# Patient Record
Sex: Female | Born: 1940 | Race: White | Hispanic: No | Marital: Single | Smoking: Former smoker
Health system: Southern US, Community
[De-identification: ages and names within clinical notes are randomized; demographics above are authoritative.]

## PROBLEM LIST (undated history)

## (undated) HISTORY — PX: APPENDECTOMY: SHX54

## (undated) HISTORY — PX: SKIN BIOPSY: SHX1

---

## 2001-03-03 ENCOUNTER — Encounter: Payer: Self-pay | Admitting: Otolaryngology

## 2001-03-03 ENCOUNTER — Encounter: Admission: RE | Admit: 2001-03-03 | Discharge: 2001-03-03 | Payer: Self-pay | Admitting: Otolaryngology

## 2001-03-25 ENCOUNTER — Other Ambulatory Visit: Admission: RE | Admit: 2001-03-25 | Discharge: 2001-03-25 | Payer: Self-pay | Admitting: Otolaryngology

## 2003-07-30 ENCOUNTER — Inpatient Hospital Stay (HOSPITAL_COMMUNITY): Admission: EM | Admit: 2003-07-30 | Discharge: 2003-08-01 | Payer: Self-pay

## 2003-08-31 ENCOUNTER — Other Ambulatory Visit: Admission: RE | Admit: 2003-08-31 | Discharge: 2003-08-31 | Payer: Self-pay | Admitting: Obstetrics and Gynecology

## 2003-09-05 ENCOUNTER — Encounter: Admission: RE | Admit: 2003-09-05 | Discharge: 2003-09-05 | Payer: Self-pay | Admitting: Gastroenterology

## 2003-09-07 ENCOUNTER — Ambulatory Visit (HOSPITAL_COMMUNITY): Admission: RE | Admit: 2003-09-07 | Discharge: 2003-09-07 | Payer: Self-pay | Admitting: Obstetrics and Gynecology

## 2003-10-02 ENCOUNTER — Ambulatory Visit (HOSPITAL_COMMUNITY): Admission: RE | Admit: 2003-10-02 | Discharge: 2003-10-03 | Payer: Self-pay | Admitting: Surgery

## 2004-02-06 ENCOUNTER — Ambulatory Visit (HOSPITAL_COMMUNITY): Admission: RE | Admit: 2004-02-06 | Discharge: 2004-02-06 | Payer: Self-pay | Admitting: Obstetrics and Gynecology

## 2004-08-23 ENCOUNTER — Ambulatory Visit (HOSPITAL_COMMUNITY): Admission: RE | Admit: 2004-08-23 | Discharge: 2004-08-23 | Payer: Self-pay | Admitting: Obstetrics and Gynecology

## 2005-01-16 IMAGING — US US ABDOMEN COMPLETE
1 series · 14 of 25 positions shown · IV contrast (agent unspecified)
Comparison: none

CLINICAL DATA: Stomach cramps. 
ULTRASOUND OF THE ABDOMEN
Multiple longitudinal and transverse images were obtained.  
The liver and spleen appear unremarkable.  Gallbladder is normal without stones or wall thickening.  The common bile duct measures 3.0 mm.  The kidneys are normal without masses or hydronephrosis with the right measuring 10.2 and the left 10.7 cm.  The abdominal aorta measures 1.6 cm.  
In the right lower quadrant in the area of the patient?s tenderness, there is a prominent loop of bowel and/or enlarged appendix measuring 2.5 X 4.0 cm in cross section.  I would recommend a CT of the abdomen and pelvis with contrast for further evaluation to evaluate for Crohn?s disease, appendicitis, diverticulitis or other abdominal pathology.  
IMPRESSION
No gallstones. 
Possible bowel related inflammatory process right lower quadrant.  Recommend CT abdomen and pelvis. 

ree

[Series 1: us abdomen · 0.33mm/px · 14 of 52 slices shown]
[im 1/52]
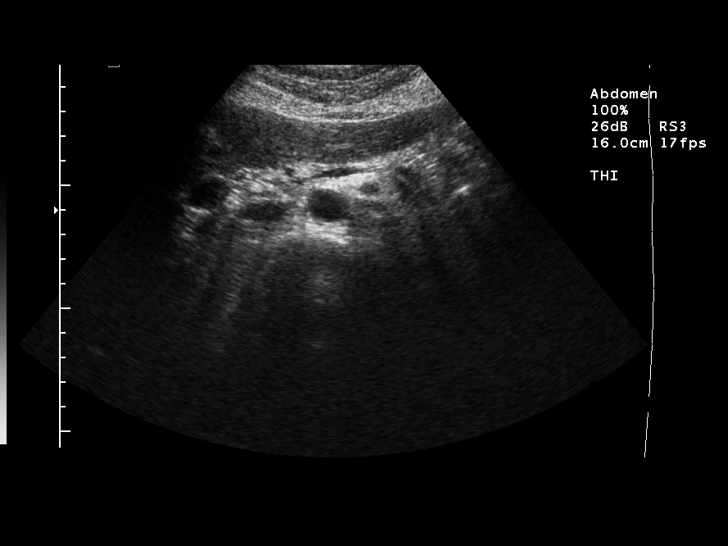
[im 5/52]
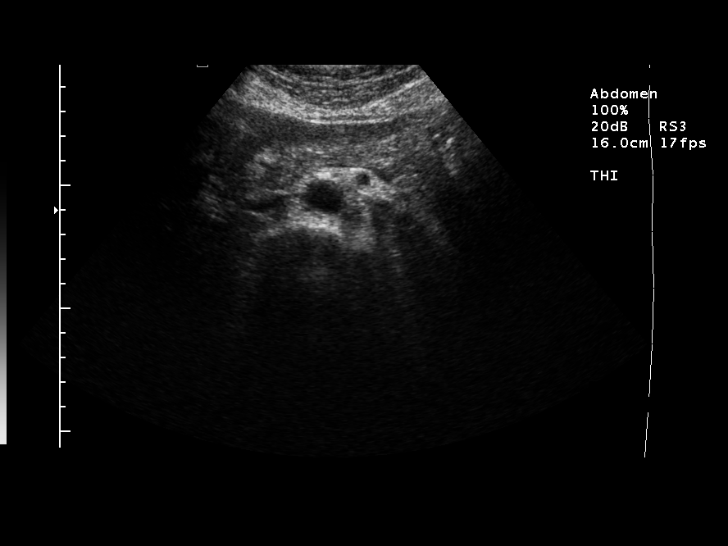
[im 9/52]
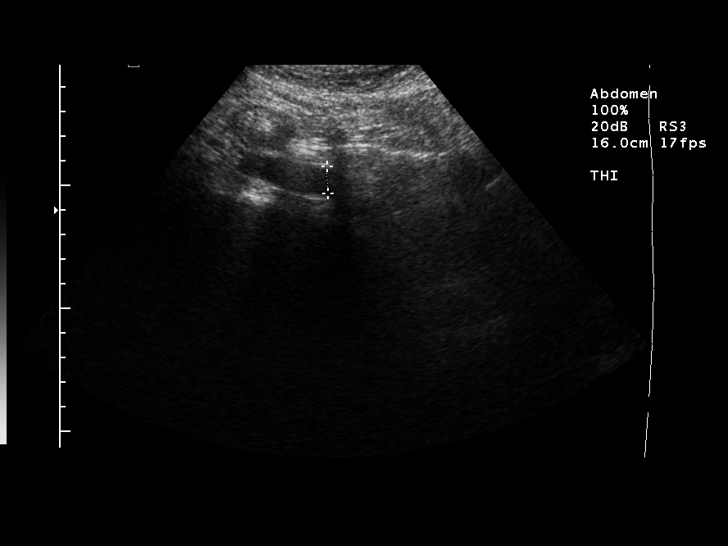
[im 13/52]
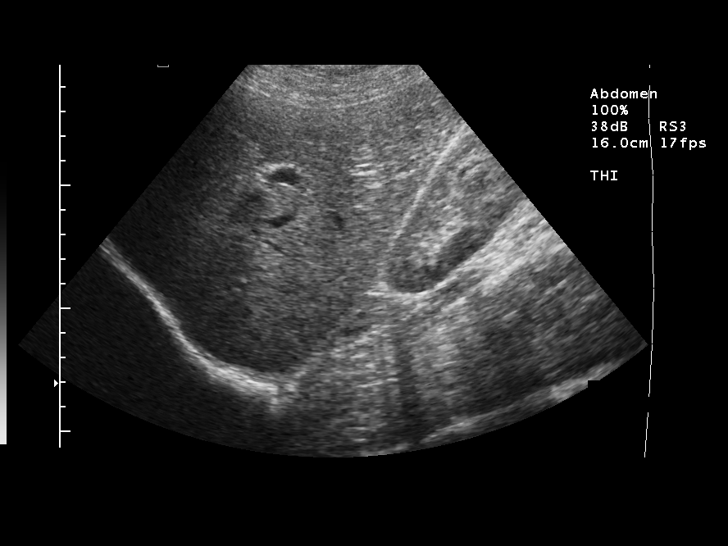
[im 18/52]
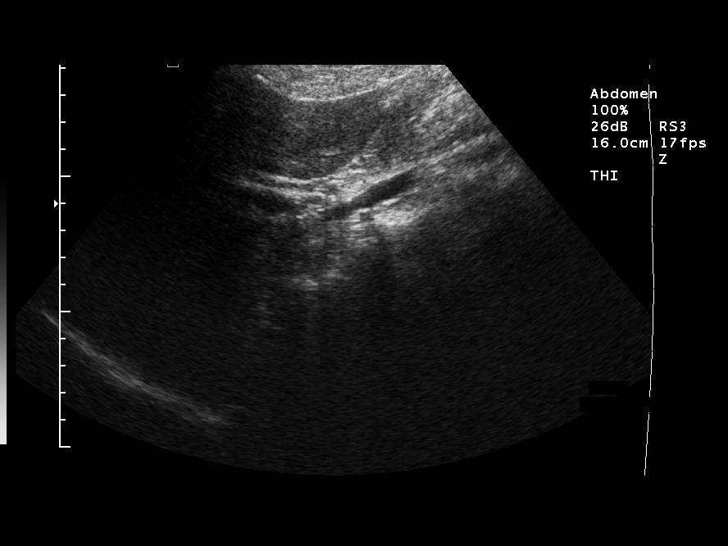
[im 20/52]
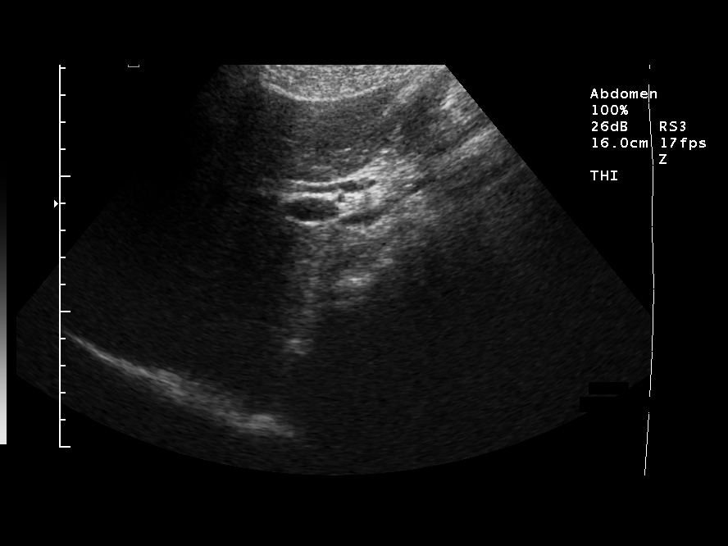
[im 24/52]
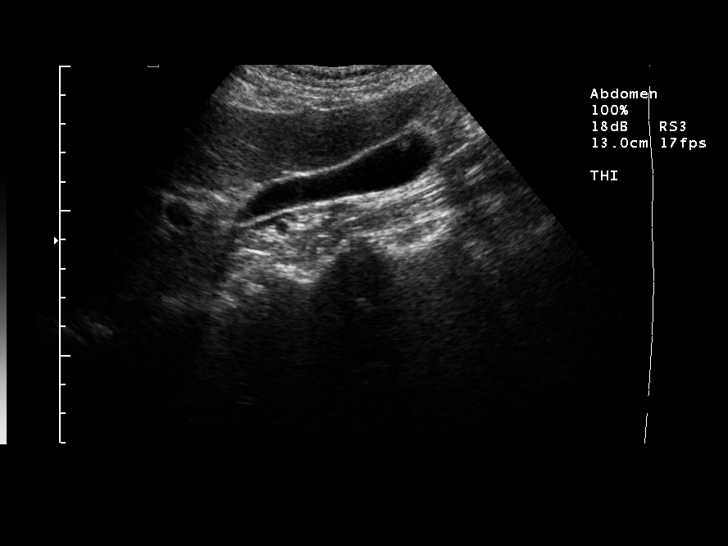
[im 28/52]
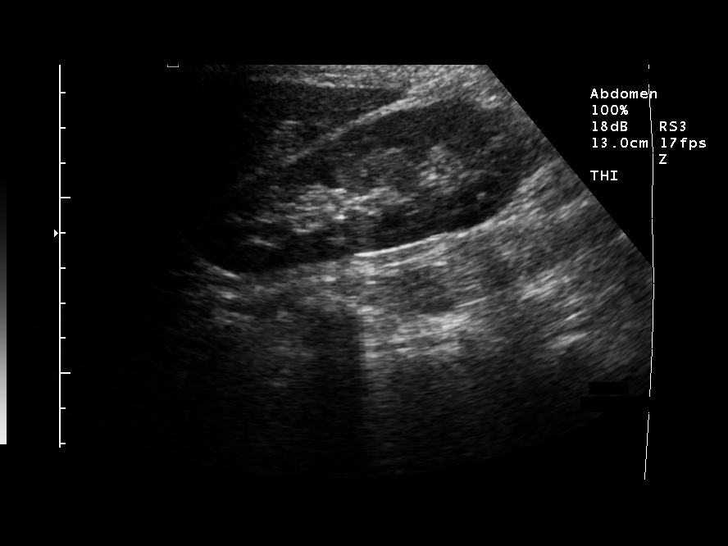
[im 32/52]
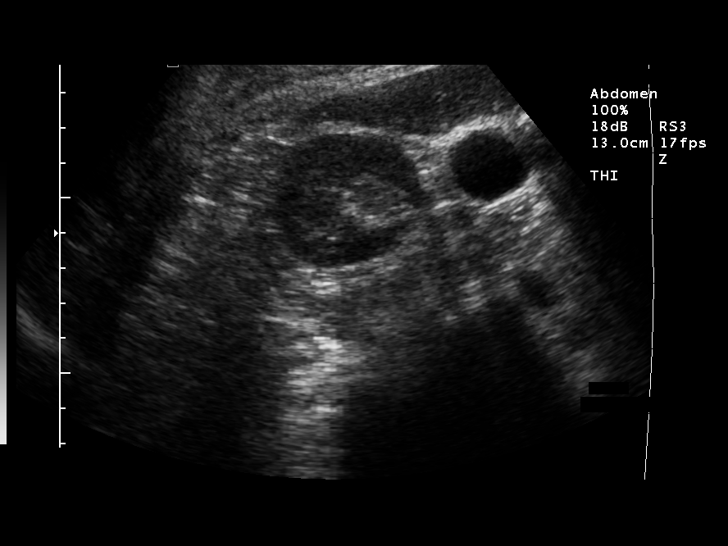
[im 35/52]
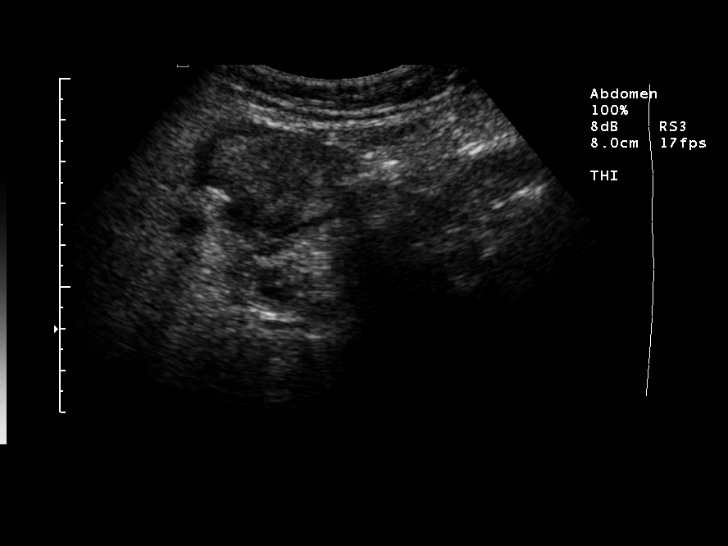
[im 39/52]
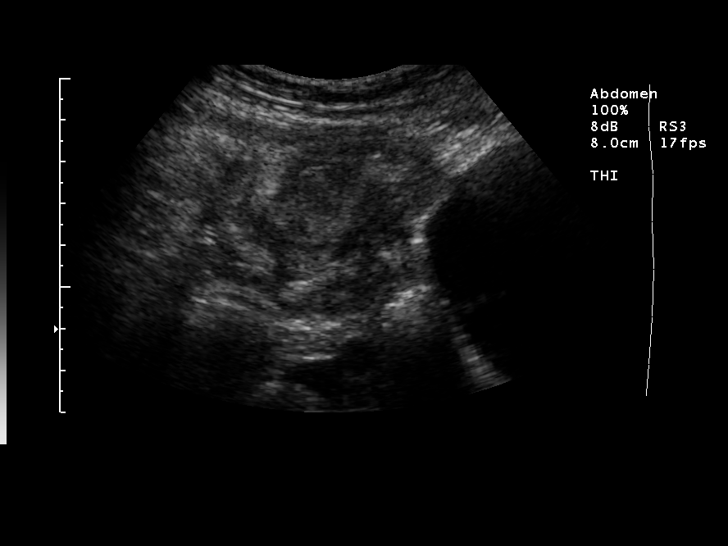
[im 43/52]
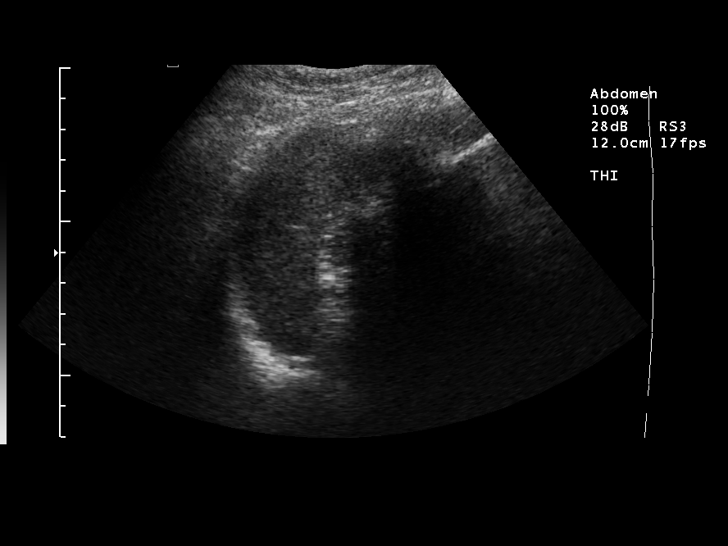
[im 47/52]
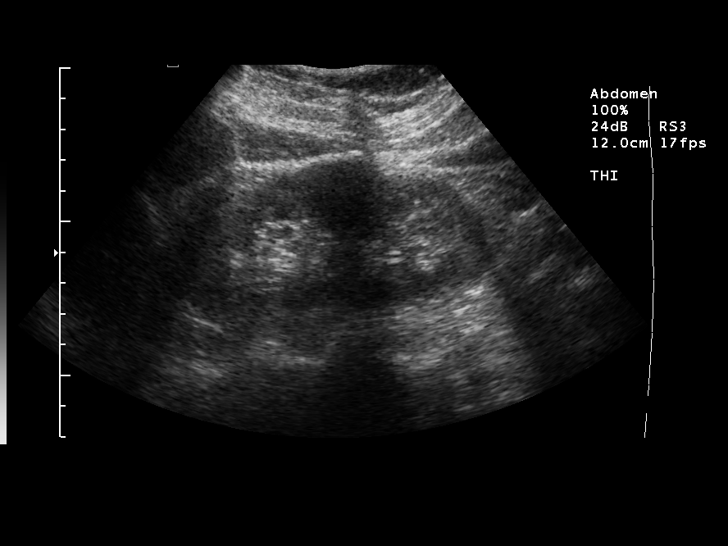
[im 52/52]
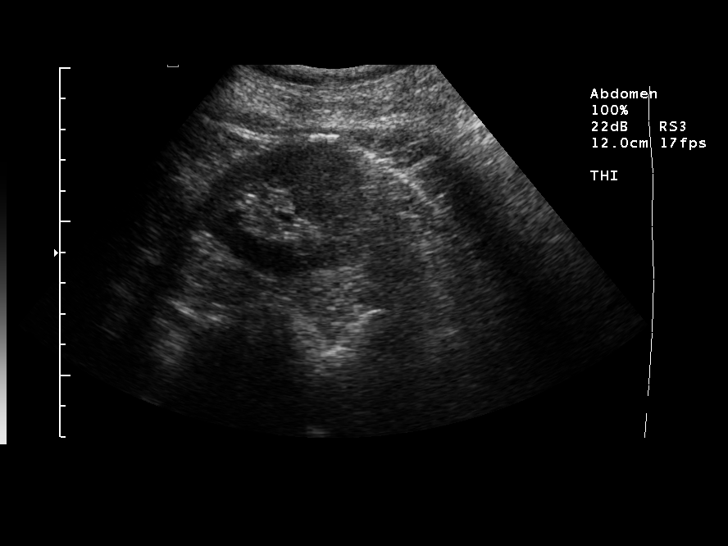

[14 of 25 positions shown; findings below may reference images not displayed]

## 2005-02-22 IMAGING — CT CT ABDOMEN W/ CM
1 series · 14 of 32 positions shown, 18 images · IV contrast (GASTRO. & OMNIPAQUE [ID])
Comparison: none

CLINICAL DATA: Abdominal pain.  Follow up abnormality found in right lower quadrant on prior CT.   Con none. 
 CT ABDOMEN W/CONTRAST: 
 Multidetector helical scans through the abdomen were performed after oral and IV contrast media were given.  744cc of Omnipaque 300 were given as the contrast media.  This scan is compared to the prior CT from [REDACTED] dated 07/30/03.  
 The lung bases are clear.  The liver enhances normally with probable hemangioma in the periphery of the right lobe posterolaterally unchanged.  No calcified gallstones are noted.  The pancreas is stable in size and configuration as are the adrenal glands and spleen.  The kidneys enhance normally with no solid lesion and no evidence of hydronephrosis.  On delayed images the pelvocaliceal systems appear normal.  The ureters are normal in caliber.  The abdominal aorta appears normal and no evidence of adenopathy is seen.

[Series 2: — · axial · 0.62mm/px · z∈[-369,-21]mm · 14 of 118 slices shown, 18 images]
[im 8/118  soft-tissue]
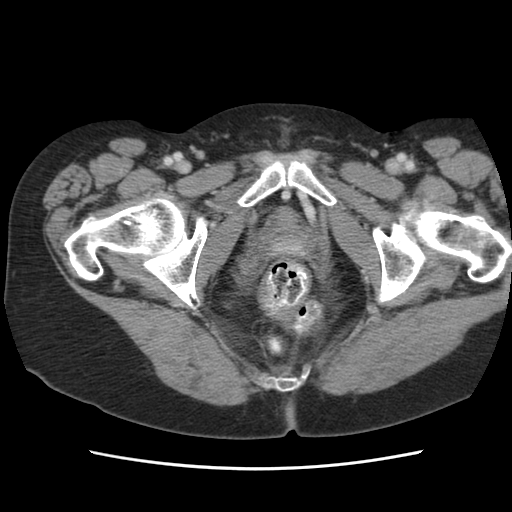
[im 8/118  bone]
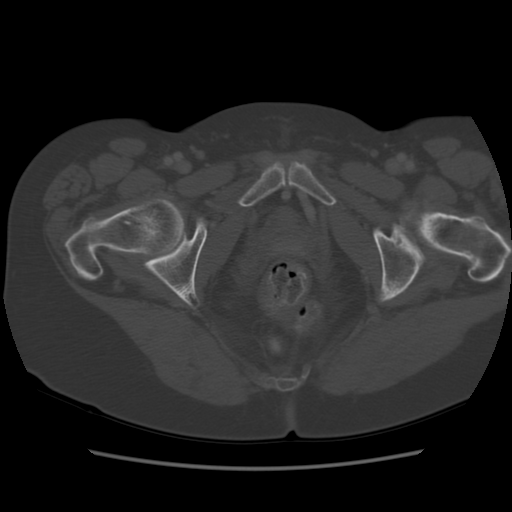
[im 16/118  soft-tissue]
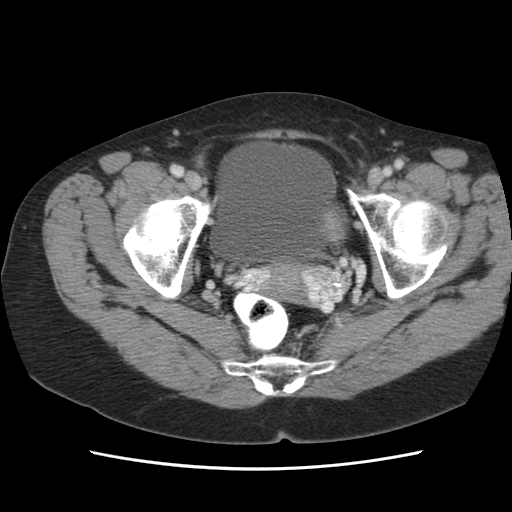
[im 27/118  soft-tissue]
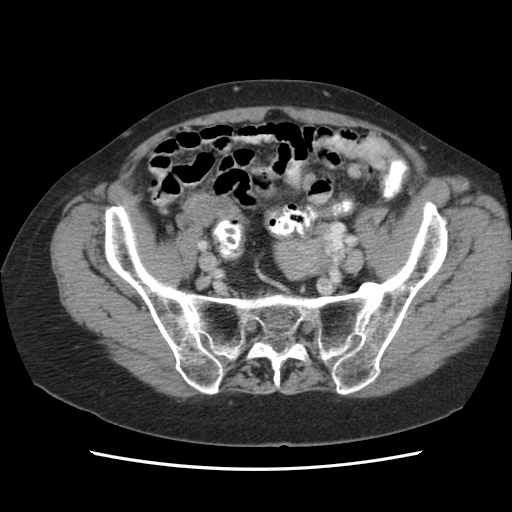
[im 34/118  soft-tissue]
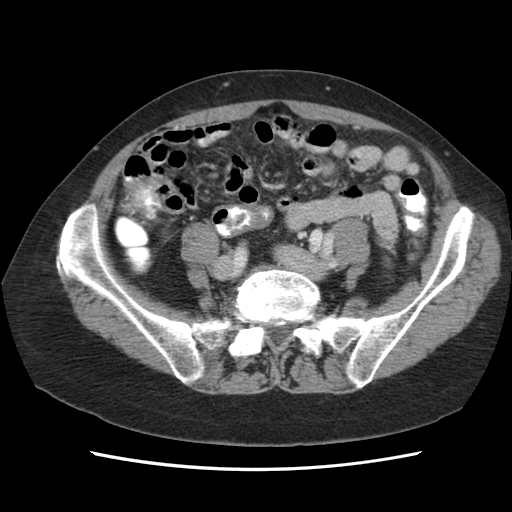
[im 46/118  soft-tissue]
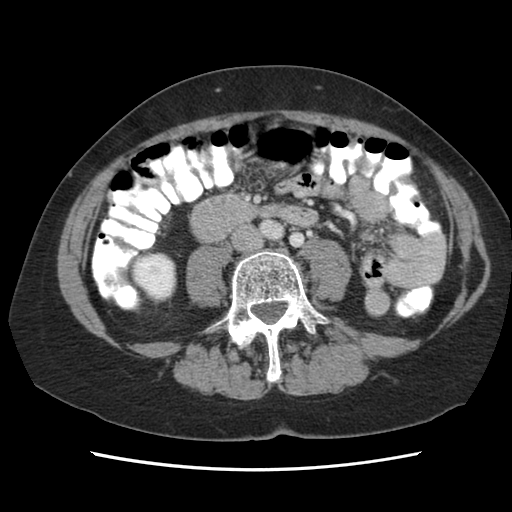
[im 53/118  soft-tissue]
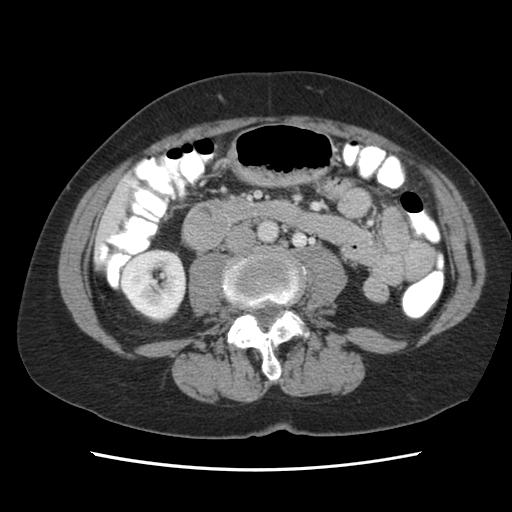
[im 65/118  soft-tissue]
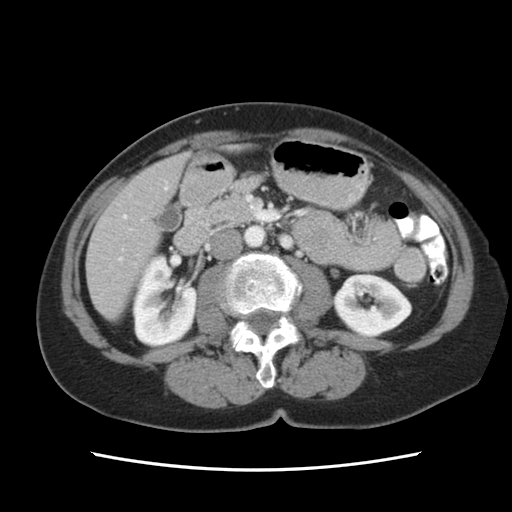
[im 72/118  soft-tissue]
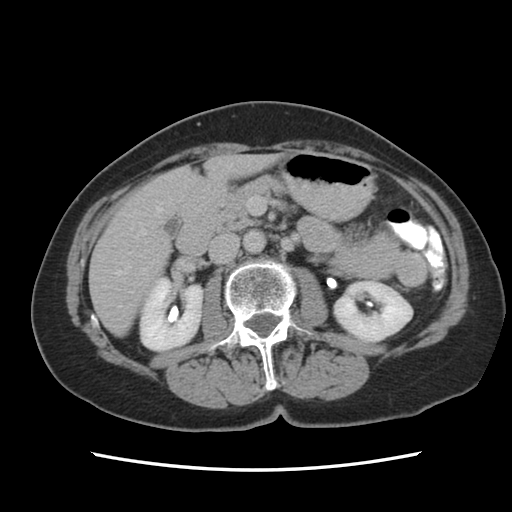
[im 84/118  soft-tissue]
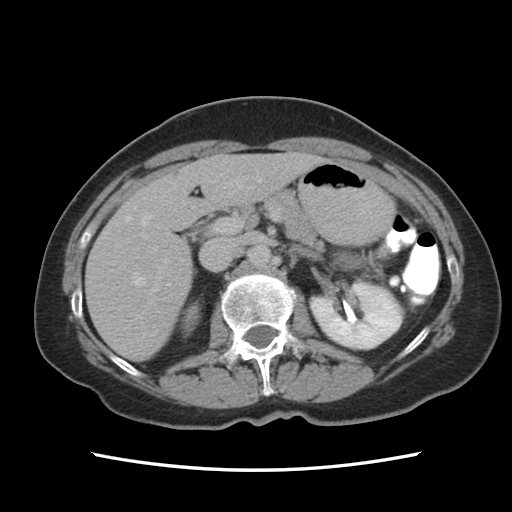
[im 84/118  bone]
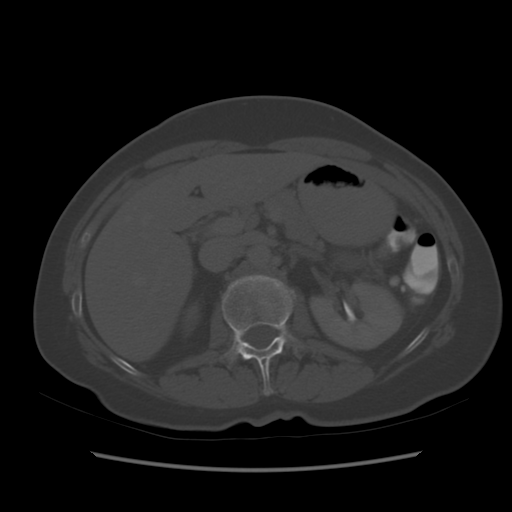
[im 91/118  soft-tissue]
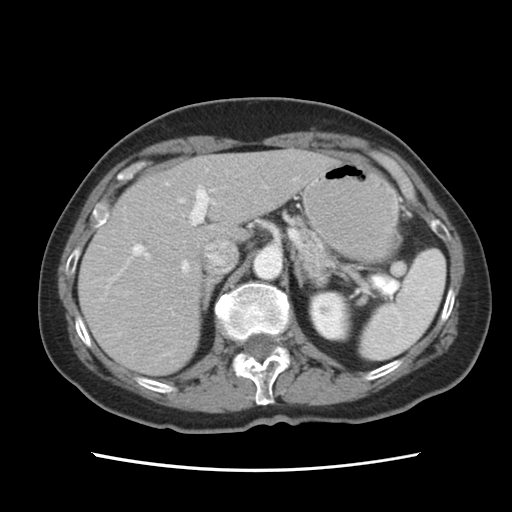
[im 102/118  soft-tissue]
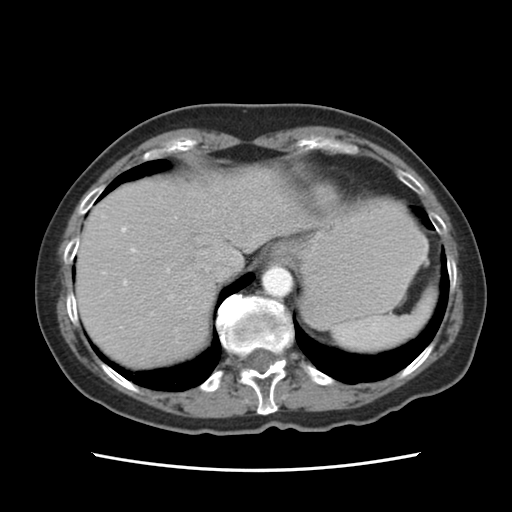
[im 102/118  lung]
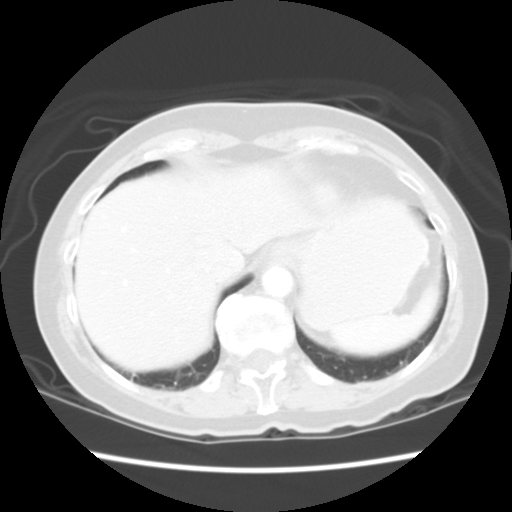
[im 106/118  lung]
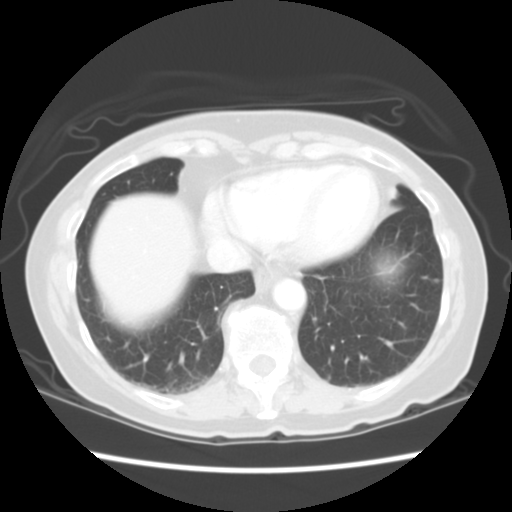
[im 110/118  soft-tissue]
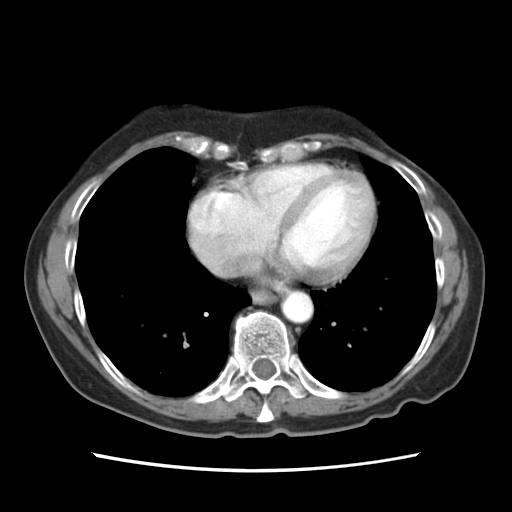
[im 110/118  lung]
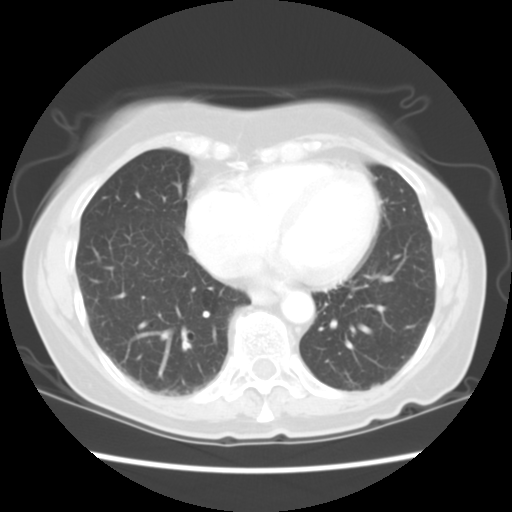
[im 114/118  lung]
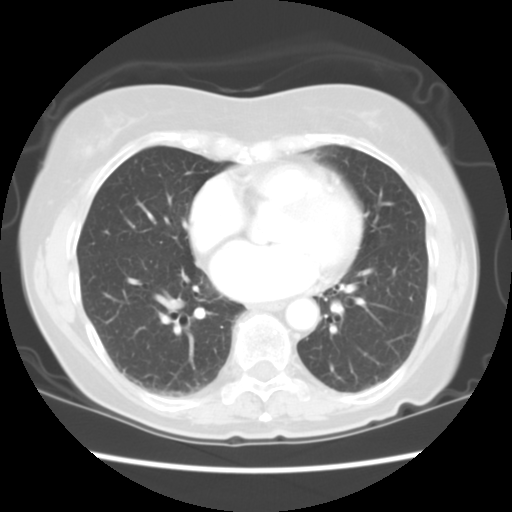

[14 of 32 positions shown; findings below may reference images not displayed]

IMPRESSION: Negative CT of the abdomen.  Stable probable hemangioma in the periphery of the right lobe of liver posterolaterally. 
 CT PELVIS W/CONTRAST: 
 Scans were continued through the pelvis after oral and IV contrast media were given.  The inflammatory process noted previously within the right lower quadrant appears to have improved.  There is still some mucosal edema in the region of the terminal ileum ? ileocecal valve as well as the base of the cecum, but this has improved significantly compared to the prior study of 07/30/03.  The appendix is visualized and does fill with contrast, appearing normal by CT.  Does the patient have Crohn's disease by history?  The uterus is unchanged with a somewhat prominent endometrium.  Bulbus soft tissue extends toward the left adnexa from the fundus of the uterus.  I would favor this representing uterine fibroid.  It may be helpful to perform pelvic ultrasound to assess this area further.  The urinary bladder is unremarkable.  No significant fluid is seen within the pelvis.
IMPRESSION: 1.  Significant improvement in inflammatory process within the right pelvis ? right lower quadrant.  The appendix on this study fills normally.  Is there any history of Crohn's disease?
 2.  Probable pedunculated fibroid from the fundus extending toward the left adnexa.  Consider transvaginal ultrasound to assess further.

## 2005-07-26 IMAGING — US US PELVIS COMPLETE
1 series · 13 of 25 positions shown · non-contrast
Comparison: none

CLINICAL DATA: Followup uterine fibroids. 
 TRANSABDOMINAL PELVIC ULTRASOUND 
 Transabdominal sonography of the pelvis is performed and comparison made to a prior pelvic MRI performed on 09/07/2003.  Transvaginal sonography was refused by the patient. 
 The uterus measures approximately 7.2 x 2.7 x 4.7 cm in greatest dimensions.  An exophytic fibroid is again seen extending from the left anterior uterine body.  This measures approximately 4.2 x 3.8 x 3.5 cm.  This has not significantly changed in size compared with the previous pelvic MRI.  In addition, at least two other small less than 1 cm fibroids are seen within the anterior and posterior uterine body which are also not significantly changed compared with the previous MRI. 
 The right ovary was visualized on transvaginal sonography and is normal in size and appearance.  The left ovary was not directly visualized by the transabdominal or transvaginal sonography but no left-sided adnexal masses are identified.  There is no evidence of free fluid. 
 IMPRESSION
 Several uterine fibroids, with largest subserosal fibroid projecting from the left anterior uterine body measuring 4.2 cm in greatest diameter. These fibroids have not significantly changed compared with the previous MRI on 09/07/2003.  
 Normal appearance of the right ovary.  Nonvisualization of the left ovary.

[Series 1: unknown · 0.25mm/px · 13 of 29 slices shown]
[im 1/29]
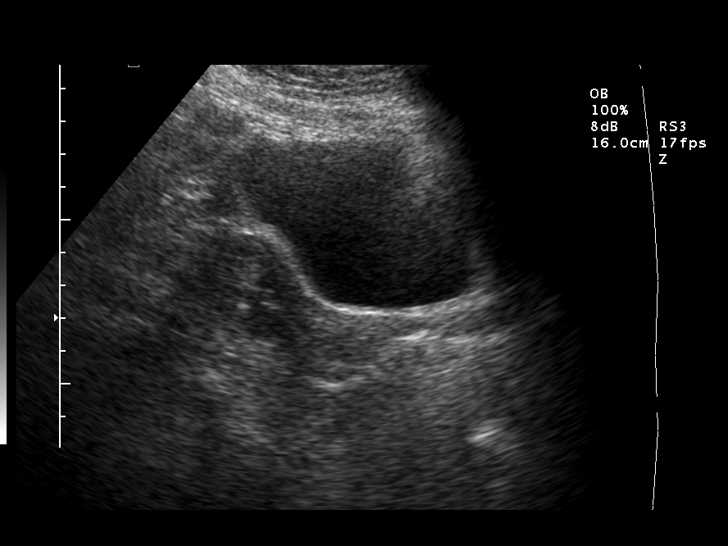
[im 3/29]
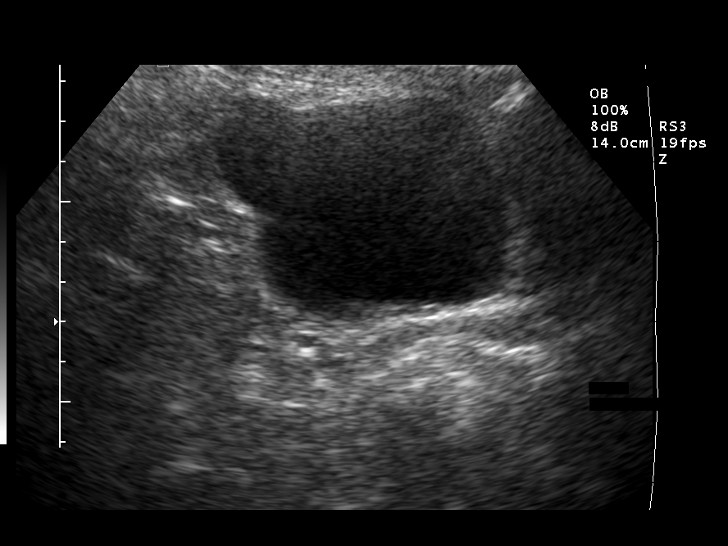
[im 5/29]
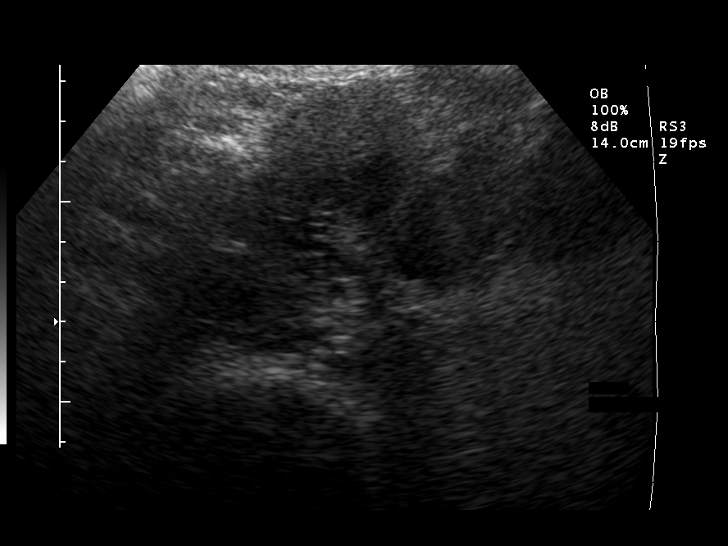
[im 8/29]
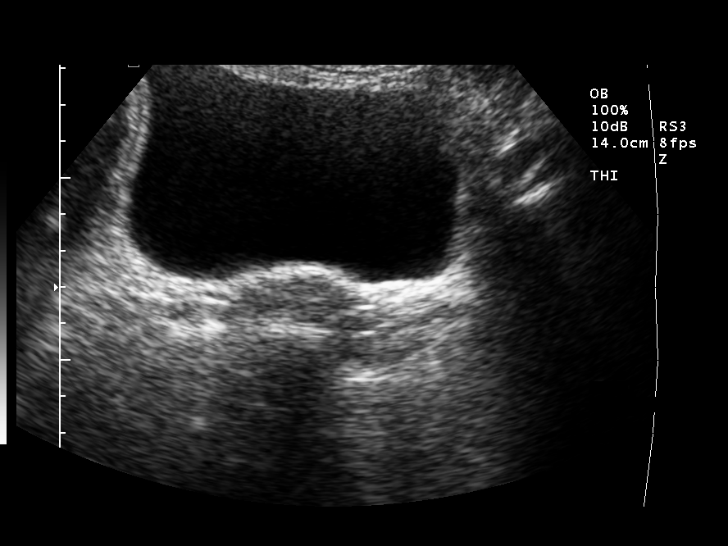
[im 10/29]
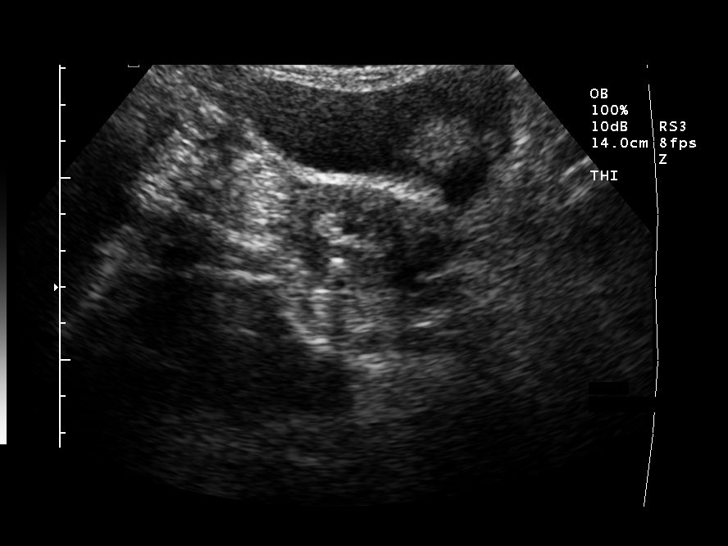
[im 12/29]
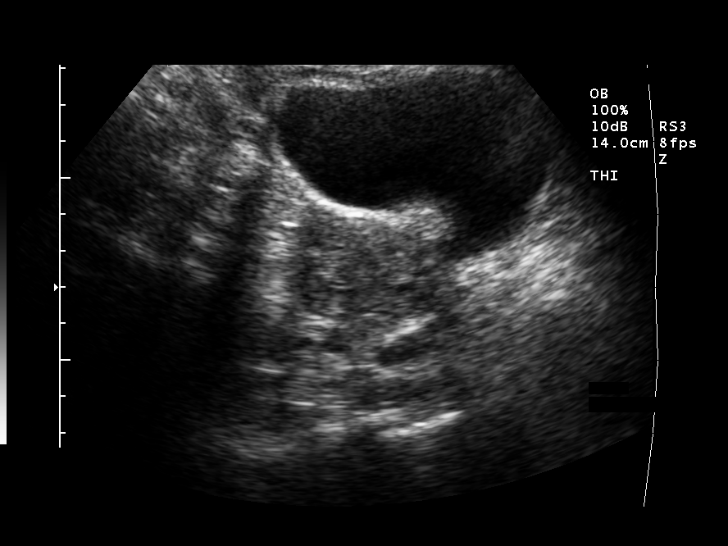
[im 15/29]
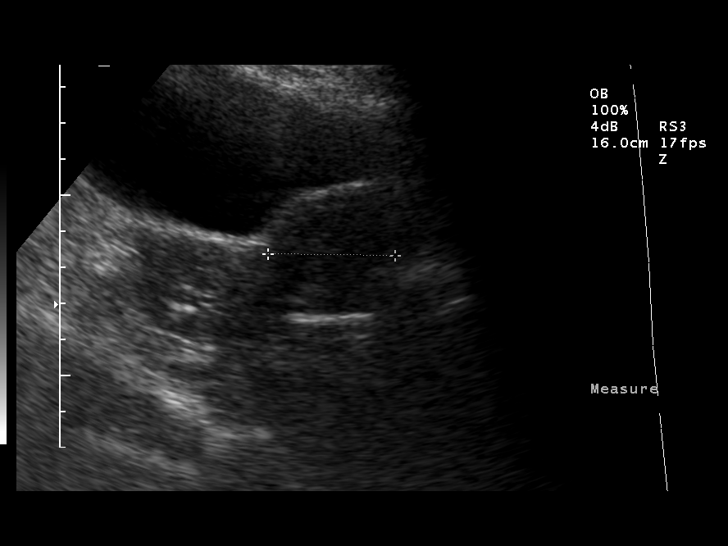
[im 17/29]
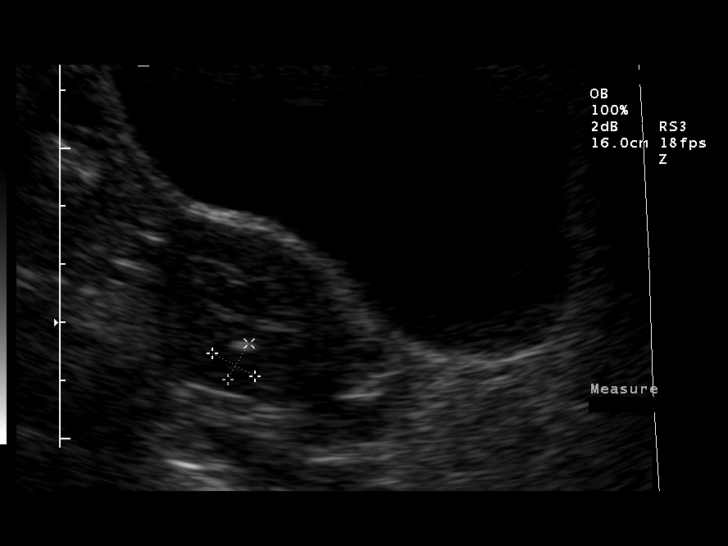
[im 19/29]
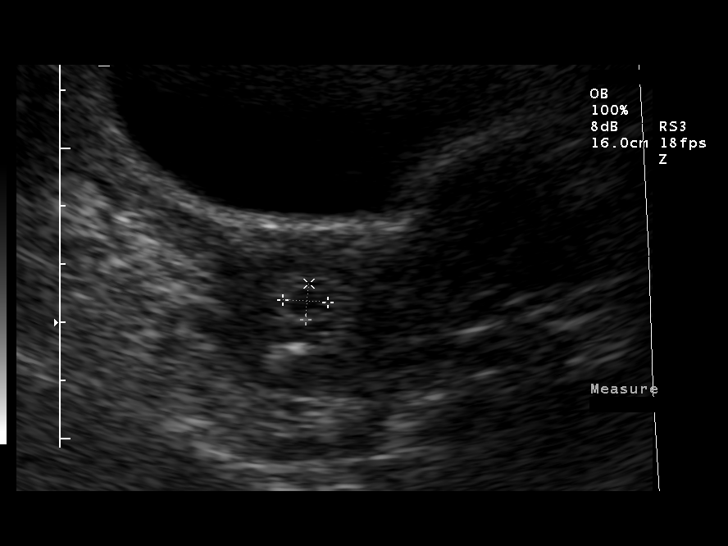
[im 22/29]
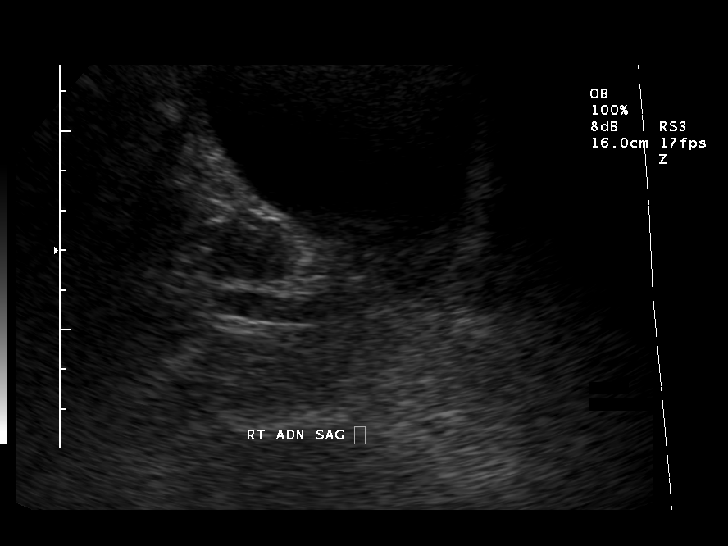
[im 24/29]
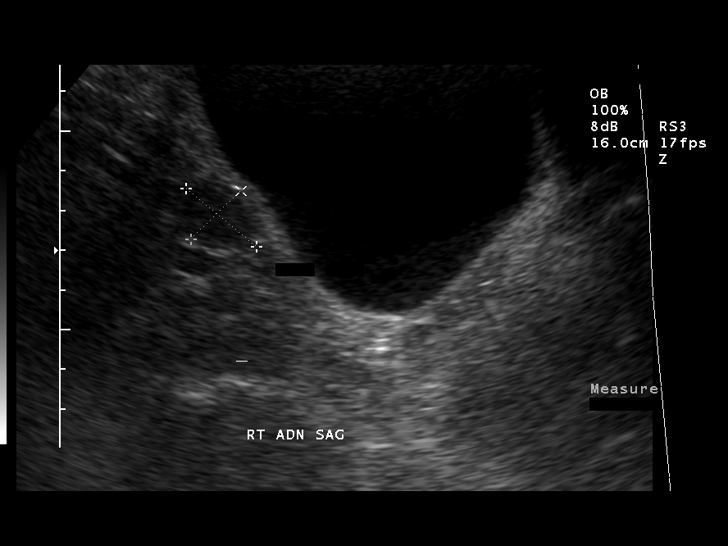
[im 26/29]
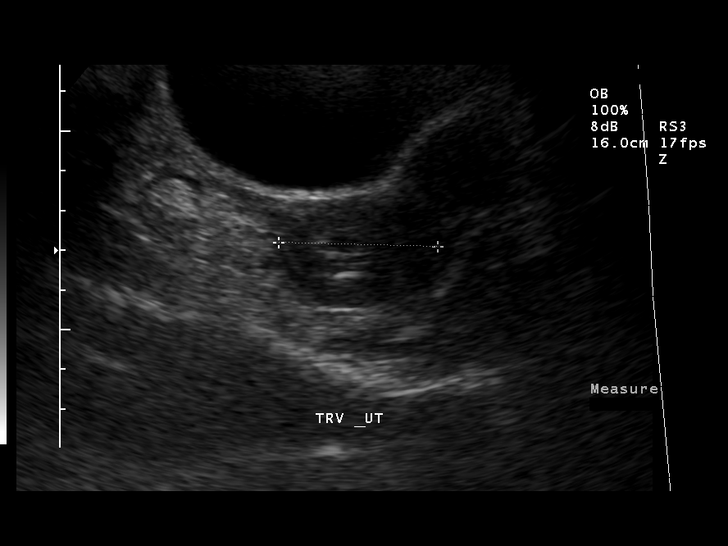
[im 29/29]
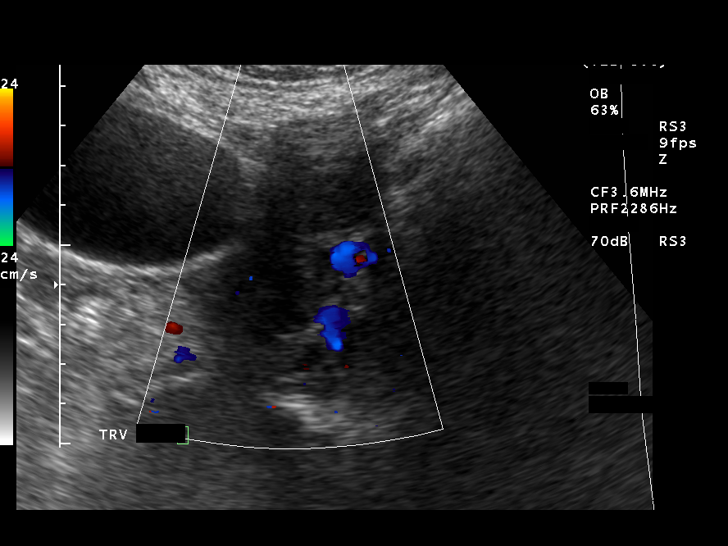

[13 of 25 positions shown; findings below may reference images not displayed]

## 2006-02-10 IMAGING — US US PELVIS COMPLETE
1 series · 18 of 25 positions shown · non-contrast
Comparison: 02/06/04.

CLINICAL DATA: Left adnexal mass.  Follow-up fibroids.  
TRANSABDOMINAL PELVIC ULTRASOUND:
Transabdominal sonography of the pelvis was performed.  Transvaginal sonography was attempted, but was not successful as the transvaginal probe could not be passed into the patient?s vagina.

[Series 1: us transvaginal non-ob · 18 of 42 slices shown]
[im 1/42]
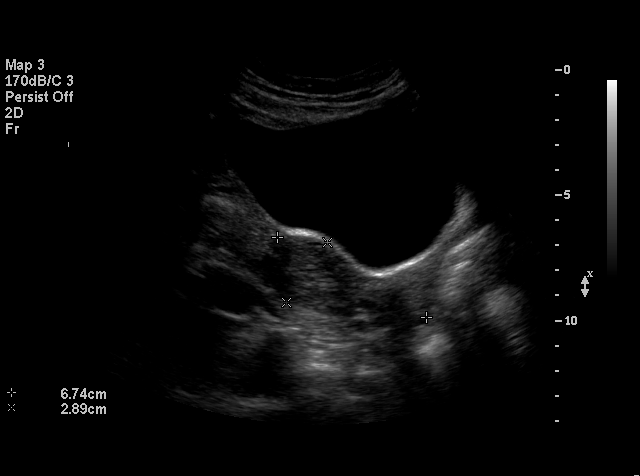
[im 4/42]
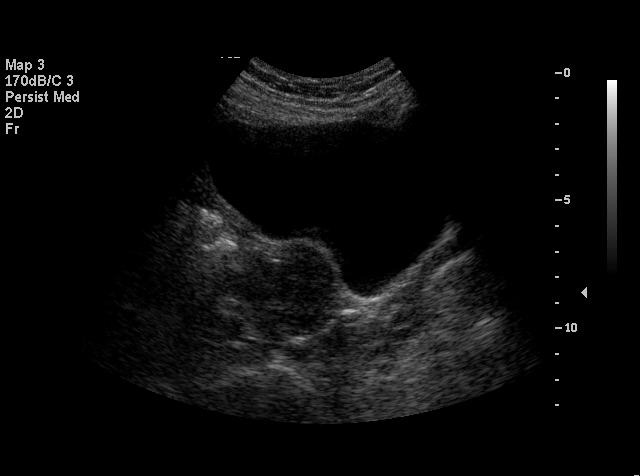
[im 6/42]
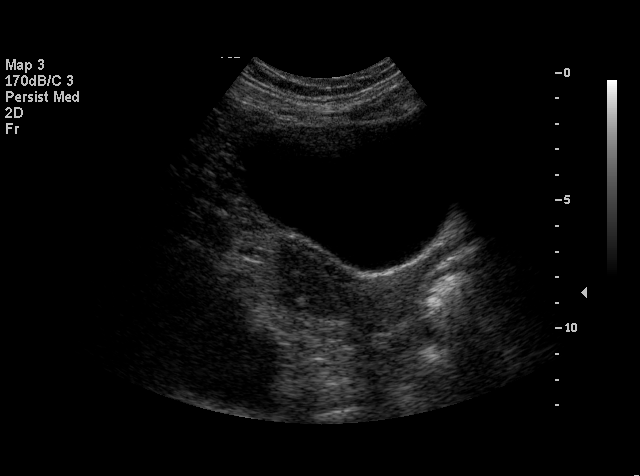
[im 7/42]
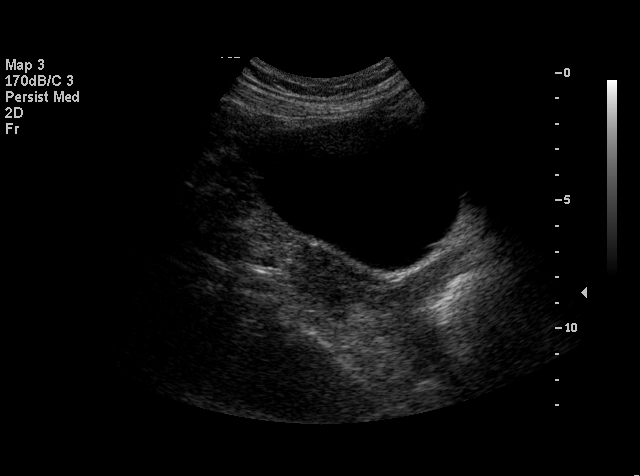
[im 11/42]
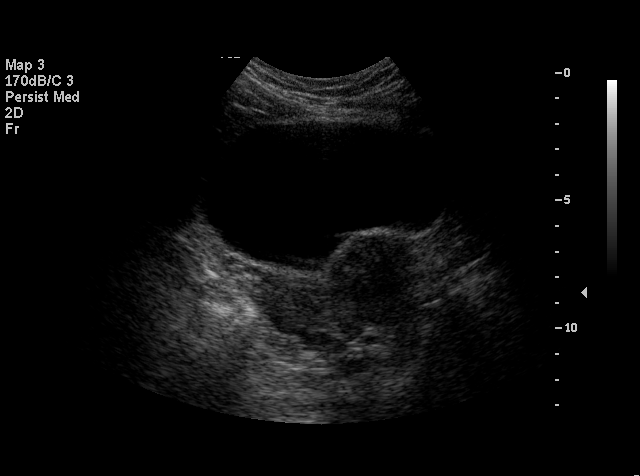
[im 12/42]
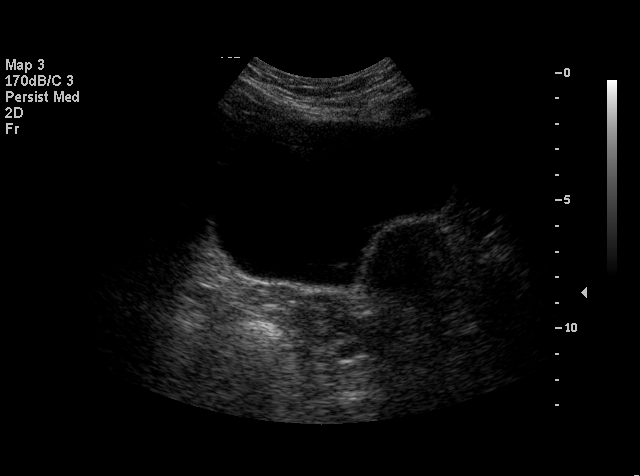
[im 16/42]
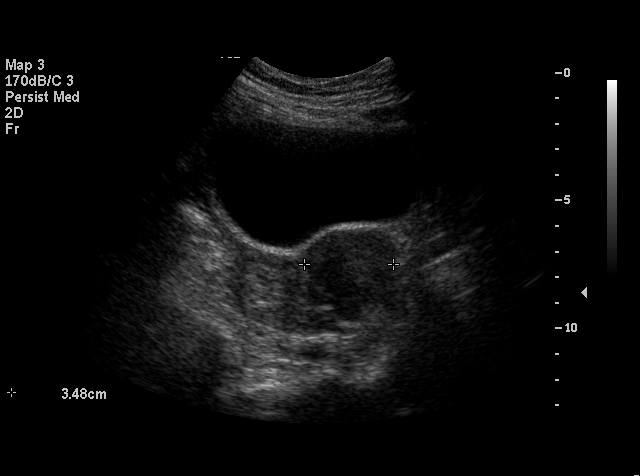
[im 18/42]
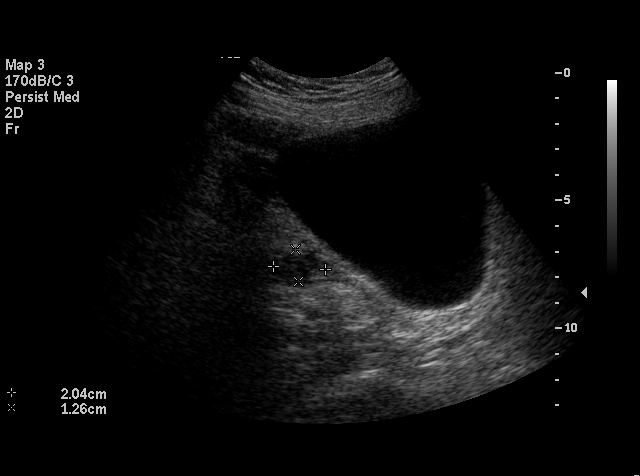
[im 19/42]
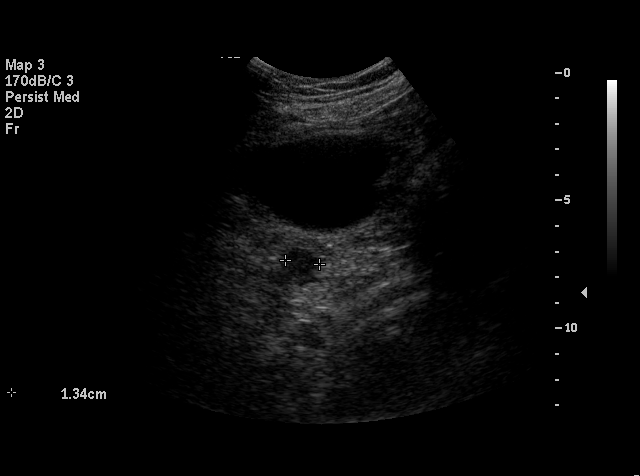
[im 23/42]
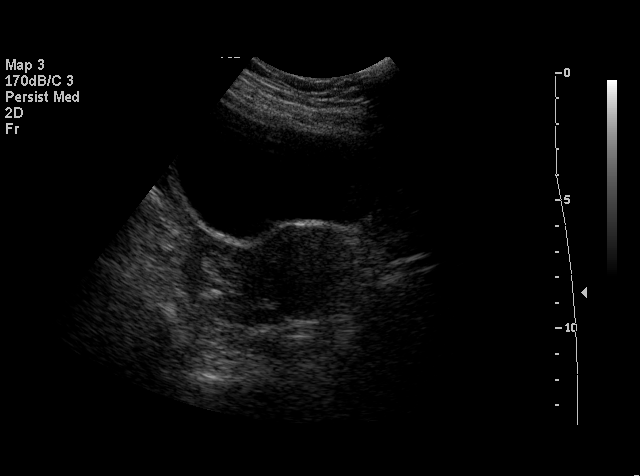
[im 24/42]
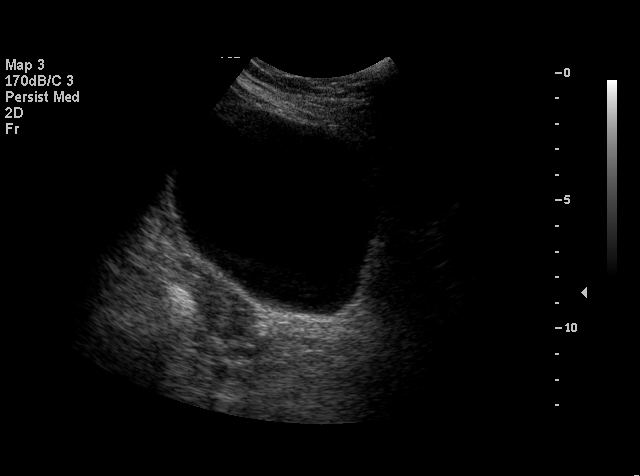
[im 26/42]
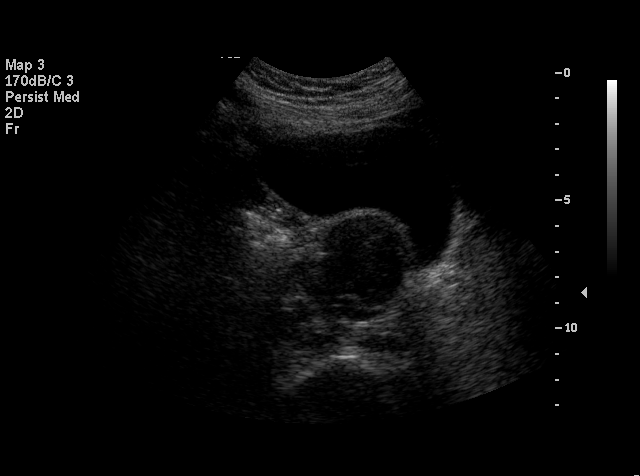
[im 30/42]
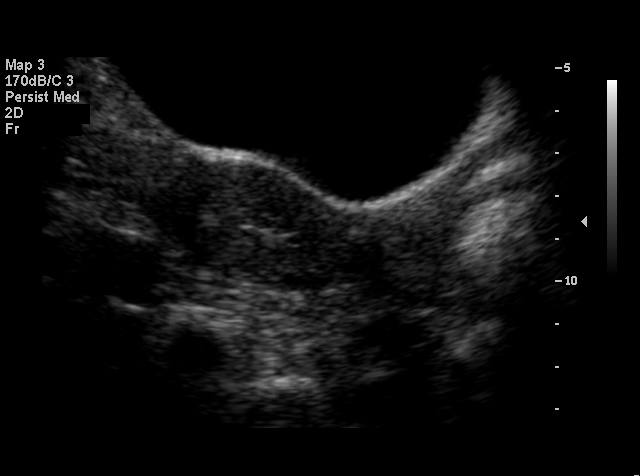
[im 31/42]
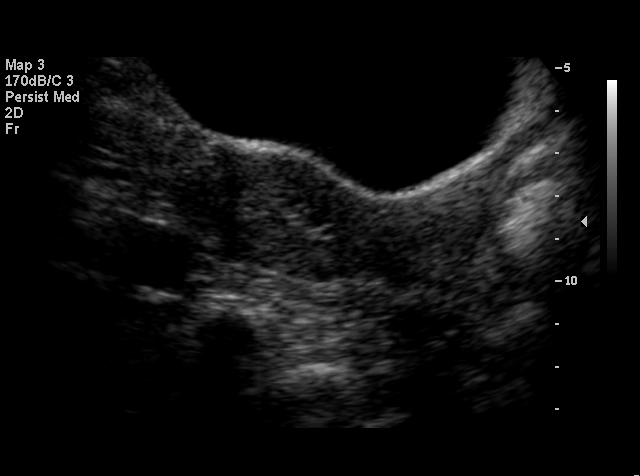
[im 35/42]
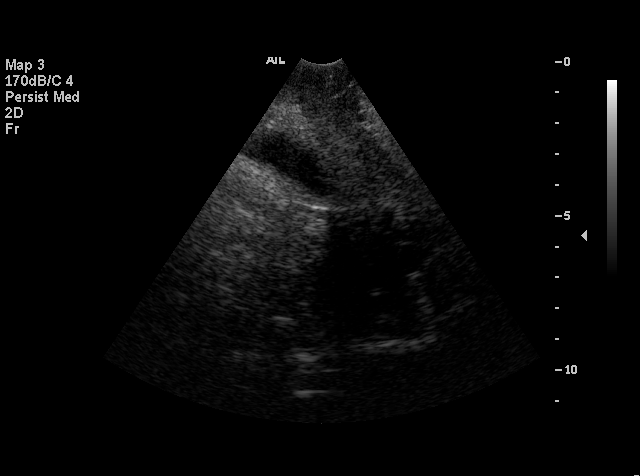
[im 36/42]
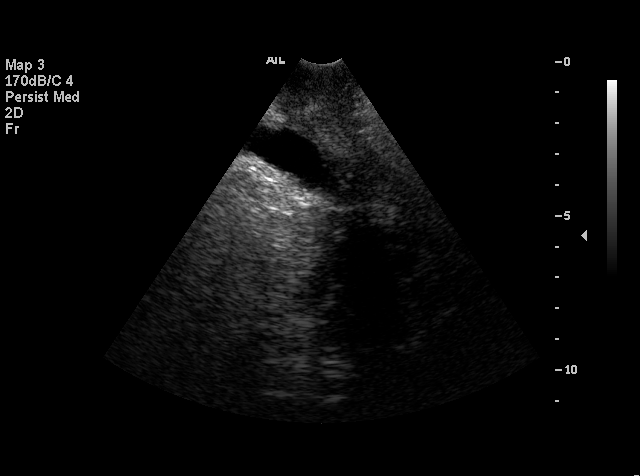
[im 38/42]
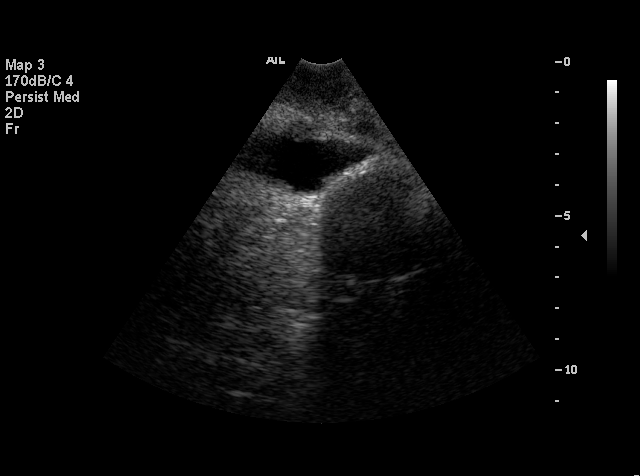
[im 42/42]
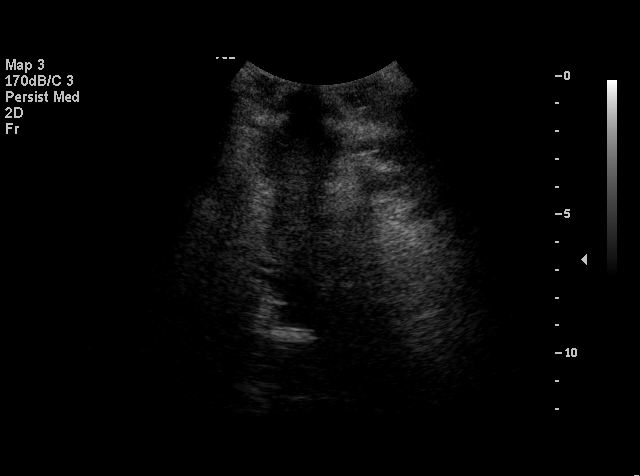

[18 of 25 positions shown; findings below may reference images not displayed]

Again seen is a subserosal fibroid along the left lateral uterine fundus projecting into the left adnexa which measures 3.2 x 3.6 x 3.5 cm.  This is not significantly changed in size since prior study.  Probable other small fibroids measuring less than 1 cm are also seen in the uterine body which are difficult to visualize due to their small size, but have not significantly changed since prior exam.
The right ovary is visualized and is normal in appearance.  The left ovary is not directly visualized, but no left adnexal masses identified.
IMPRESSION: 1.  Several uterine fibroids, with largest subserosal fibroid projecting from the left anterior uterine body and measuring approximately 3.7 cm in greatest diameter.  This has not significantly changed since prior exam.
2.  Normal appearance of the right ovary.  Nonvisualization of the left ovary.

## 2010-08-24 ENCOUNTER — Encounter: Payer: Self-pay | Admitting: Obstetrics and Gynecology

## 2021-07-22 ENCOUNTER — Emergency Department (HOSPITAL_BASED_OUTPATIENT_CLINIC_OR_DEPARTMENT_OTHER)
Admission: EM | Admit: 2021-07-22 | Discharge: 2021-07-22 | Disposition: A | Discharge status: Home / Self Care | Payer: Medicare Other | Attending: Emergency Medicine | Admitting: Emergency Medicine

## 2021-07-22 ENCOUNTER — Emergency Department (HOSPITAL_BASED_OUTPATIENT_CLINIC_OR_DEPARTMENT_OTHER): Payer: Medicare Other

## 2021-07-22 ENCOUNTER — Encounter (HOSPITAL_BASED_OUTPATIENT_CLINIC_OR_DEPARTMENT_OTHER): Payer: Self-pay | Admitting: *Deleted

## 2021-07-22 ENCOUNTER — Other Ambulatory Visit: Payer: Self-pay

## 2021-07-22 DIAGNOSIS — E119 Type 2 diabetes mellitus without complications: Secondary | ICD-10-CM | POA: Insufficient documentation

## 2021-07-22 DIAGNOSIS — Z87891 Personal history of nicotine dependence: Secondary | ICD-10-CM | POA: Diagnosis not present

## 2021-07-22 DIAGNOSIS — R55 Syncope and collapse: Secondary | ICD-10-CM

## 2021-07-22 DIAGNOSIS — I2510 Atherosclerotic heart disease of native coronary artery without angina pectoris: Secondary | ICD-10-CM | POA: Insufficient documentation

## 2021-07-22 DIAGNOSIS — I10 Essential (primary) hypertension: Secondary | ICD-10-CM | POA: Diagnosis not present

## 2021-07-22 DIAGNOSIS — Z20822 Contact with and (suspected) exposure to covid-19: Secondary | ICD-10-CM | POA: Insufficient documentation

## 2021-07-22 DIAGNOSIS — Z79899 Other long term (current) drug therapy: Secondary | ICD-10-CM | POA: Diagnosis not present

## 2021-07-22 LAB — CBC
HCT: 43.8 % (ref 36.0–46.0)
Hemoglobin: 14.4 g/dL (ref 12.0–15.0)
MCH: 30.4 pg (ref 26.0–34.0)
MCHC: 32.9 g/dL (ref 30.0–36.0)
MCV: 92.6 fL (ref 80.0–100.0)
Platelets: 302 10*3/uL (ref 150–400)
RBC: 4.73 MIL/uL (ref 3.87–5.11)
RDW: 12.9 % (ref 11.5–15.5)
WBC: 8 10*3/uL (ref 4.0–10.5)
nRBC: 0 % (ref 0.0–0.2)

## 2021-07-22 LAB — URINALYSIS, ROUTINE W REFLEX MICROSCOPIC
Bilirubin Urine: NEGATIVE
Glucose, UA: NEGATIVE mg/dL
Hgb urine dipstick: NEGATIVE
Ketones, ur: NEGATIVE mg/dL
Leukocytes,Ua: NEGATIVE
Nitrite: NEGATIVE
Protein, ur: NEGATIVE mg/dL
Specific Gravity, Urine: <1.005 — ABNORMAL LOW (ref 1.005–1.030)
pH: 6.5 (ref 5.0–8.0)

## 2021-07-22 LAB — BASIC METABOLIC PANEL
Anion gap: 9 (ref 5–15)
BUN: 20 mg/dL (ref 8–23)
CO2: 29 mmol/L (ref 22–32)
Calcium: 10.5 mg/dL — ABNORMAL HIGH (ref 8.9–10.3)
Chloride: 97 mmol/L — ABNORMAL LOW (ref 98–111)
Creatinine, Ser: 0.84 mg/dL (ref 0.44–1.00)
GFR, Estimated: >60 mL/min (ref >60)
Glucose, Bld: 110 mg/dL — ABNORMAL HIGH (ref 70–99)
Potassium: 4.3 mmol/L (ref 3.5–5.1)
Sodium: 135 mmol/L (ref 135–145)

## 2021-07-22 LAB — RESP PANEL BY RT-PCR (FLU A&B, COVID) ARPGX2
Influenza A by PCR: NEGATIVE
Influenza B by PCR: NEGATIVE
SARS Coronavirus 2 by RT PCR: NEGATIVE

## 2021-07-22 NOTE — Discharge Instructions (Signed)
Continue your medications.  Follow-up with your doctor to be rechecked.  Return as needed for worsening or recurrent symptoms

## 2021-07-22 NOTE — ED Triage Notes (Signed)
Yesterday felt weak, shaking, worse this am , called [facility], ECG done. Presents with also stating that yesterday when she took her BP meds she felt light headed

## 2021-07-22 NOTE — ED Provider Notes (Signed)
[facility] EMERGENCY DEPT Provider Note   CSN: [id number] Arrival date & time: 07/22/21  1017     History Chief Complaint  Patient presents with   Weakness    Samantha Faulkner is a 80 y.o. female.   Weakness  Patient presented to the ER for evaluation of weakness, near syncope, lightheadedness.  Patient states she had an episode last evening where she suddenly felt weak and shaky as if she was going to pass out.  Patient was able to lie down and somewhat passed.  However this morning she is still having a sense of lightheadedness and a pressure in her head.  She is not having any trouble with chest pain.  She denies any fevers or chills.  She does not have any shortness of breath.  No vomiting or diarrhea. Patient did take her blood pressure medications last evening before this started.  Her blood pressure is still somewhat elevated.  This morning was also still elevated but she did not take her morning dose of medications.  Past Medical History:  Diagnosis Date   Coronary artery disease    Diabetes mellitus without complication (HCC)    Hypertension      History reviewed. No pertinent family history.  Social History   Tobacco Use   Smoking status: Former    Types: Cigarettes  Substance Use Topics   Alcohol use: Yes    Comment: very light/ social    Home Medications Prior to Admission medications   Medication Sig Start Date End Date Taking? Authorizing Provider  amLODipine (NORVASC) 5 MG tablet Take 5 mg by mouth daily. 05/11/21   [provider]  atenolol (TENORMIN) 50 MG tablet Take 50 mg by mouth daily. 05/13/21   [provider]  olmesartan (BENICAR) 40 MG tablet Take 40 mg by mouth daily. 05/11/21   [provider]    Allergies    Prednisone  Review of Systems   Review of Systems  Neurological:  Positive for weakness.  All other systems reviewed and are negative.  Physical Exam Updated Vital Signs BP (!)  151/80    Pulse 74    Temp 98 F (36.7 C) (Oral)    Resp 13    Ht 1.473 m (4\' 10" )    Wt 47.6 kg    SpO2 99%    BMI 21.95 kg/m   Physical Exam Vitals and nursing note reviewed.  Constitutional:      General: She is not in acute distress.    Appearance: She is well-developed.  HENT:     Head: Normocephalic and atraumatic.     Right Ear: External ear normal.     Left Ear: External ear normal.  Eyes:     General: No scleral icterus.       Right eye: No discharge.        Left eye: No discharge.     Conjunctiva/sclera: Conjunctivae normal.  Neck:     Trachea: No tracheal deviation.  Cardiovascular:     Rate and Rhythm: Normal rate and regular rhythm.  Pulmonary:     Effort: Pulmonary effort is normal. No respiratory distress.     Breath sounds: Normal breath sounds. No stridor. No wheezing or rales.  Abdominal:     General: Bowel sounds are normal. There is no distension.     Palpations: Abdomen is soft.     Tenderness: There is no abdominal tenderness. There is no guarding or rebound.  Musculoskeletal:  General: No tenderness.     Cervical back: Neck supple.  Skin:    General: Skin is warm and dry.     Findings: No rash.  Neurological:     Mental Status: She is alert and oriented to person, place, and time.     Cranial Nerves: No cranial nerve deficit (No facial droop, extraocular movements intact, tongue midline ).     Sensory: No sensory deficit.     Motor: No abnormal muscle tone or seizure activity.     Coordination: Coordination normal.     Comments: No pronator drift bilateral upper extrem, able to hold both legs off bed for 5 seconds, sensation intact in all extremities, no visual field cuts, no left or right sided neglect, normal finger-nose exam bilaterally, no nystagmus noted     ED Results / Procedures / Treatments   Labs (all labs ordered are listed, but only abnormal results are displayed) Labs Reviewed  BASIC METABOLIC PANEL - Abnormal; Notable for the  following components:      Result Value   Chloride 97 (*)    Glucose, Bld 110 (*)    Calcium 10.5 (*)    All other components within normal limits  URINALYSIS, ROUTINE W REFLEX MICROSCOPIC - Abnormal; Notable for the following components:   Color, Urine COLORLESS (*)    Specific Gravity, Urine <1.005 (*)    All other components within normal limits  RESP PANEL BY RT-PCR (FLU A&B, COVID) [id number]  CBC    EKG EKG Interpretation  Date/Time:  Monday July 22 2021 12:33:23 EST Ventricular Rate:  87 PR Interval:  149 QRS Duration: 91 QT Interval:  369 QTC Calculation: 444 R Axis:   59 Text Interpretation: Sinus rhythm No old tracing to compare Confirmed by Linwood Dibbles [phone]) on 07/22/2021 12:39:48 PM  Radiology CT Head Wo Contrast  Result Date: 07/22/2021 CLINICAL DATA:  Neuro deficit, acute stroke suspected EXAM: CT HEAD WITHOUT CONTRAST TECHNIQUE: Contiguous axial images were obtained from the base of the skull through the vertex without intravenous contrast. COMPARISON:  None. FINDINGS: Brain: No acute intracranial hemorrhage. No CT evidence of acute infarction. No midline shift or mass effect. No hydrocephalus. Basilar cisterns are patent. Dural lesion over the high RIGHT parietal is calcified measuring 9 mm (image 20/coronal series 4). This lesion is densely calcified. No associated cortical edema. Extensive periventricular and subcortical white matter hypodensities. Extensive generalized cortical atrophy. Vascular: No hyperdense vessel or unexpected calcification. Skull: Normal. Negative for fracture or focal lesion. Sinuses/Orbits: Paranasal sinuses and mastoid air cells are clear. Orbits are clear. Other: None. IMPRESSION: 1. No evidence acute infarction by CT imaging. 2. Extensive periventricular subcortical white matter hypodensities consistent with small vessel ischemia. 3. Marked cortical atrophy. 4. Densely calcified dural mass over the high RIGHT parietal lobe is most  consistent benign calcified meningioma. Electronically Signed   By: Genevive Bi M.D.   On: 07/22/2021 13:06    Procedures Procedures   Medications Ordered in ED Medications - No data to display  ED Course  I have reviewed the triage vital signs and the nursing notes.  Pertinent labs & imaging results that were available during my care of the patient were reviewed by me and considered in my medical decision making (see chart for details).  Clinical Course as of 07/22/21 1541  Mon Jul 22, 2021  1340 Patient attempted to ambulate.  Unable to do so.  Felt very weak and unsteady [JK]  1340 head CT was unremarkable.  CBC was normal.  Metabolic panel without acute findings [JK]  1340 Covid and flu are negative. [JK]  1437 Urinalysis normal. [JK]    Clinical Course User Index [JK] Linwood Dibbles, MD   MDM Rules/Calculators/A&P                          Patient presented to the ED with complaints of an episode of lightheadedness and dizziness that occurred yesterday.  Patient states that she noticed symptoms after taking her blood pressure.  She thinks her heart rate was a bit low however her blood pressure remained high.  Patient had some persistent symptoms today.  She is not having any focal numbness or weakness.  She is not having any trouble with chest pain or shortness of breath.  No findings to suggest acute infection.  No signs of severe dehydration or anemia.  Patient has had a normal heart rhythm here without any dysrhythmia.  No focal neurologic symptoms.  Head CT shows chronic abnormalities but no acute abnormality.  I doubt stroke or TIA.  Patient is feeling better at this point.  Etiology unclear but her ED work-up is reassuring she has not had any syncope and she feels comfortable with discharge and close outpatient follow-up.  Warning signs precautions discussed.   Final Clinical Impression(s) / ED Diagnoses Final diagnoses:  Near syncope    Rx / DC Orders ED Discharge  Orders     None        Linwood Dibbles, MD 07/22/21 1541
